# Patient Record
Sex: Female | Born: 1985 | Race: White | Hispanic: No | Marital: Single | Smoking: Never smoker
Health system: Southern US, Community
[De-identification: ages and names within clinical notes are randomized; demographics above are authoritative.]

---

## 2017-04-19 ENCOUNTER — Other Ambulatory Visit: Payer: Self-pay | Admitting: Family Medicine

## 2017-04-19 DIAGNOSIS — Z8639 Personal history of other endocrine, nutritional and metabolic disease: Secondary | ICD-10-CM

## 2017-04-24 ENCOUNTER — Ambulatory Visit
Admission: RE | Admit: 2017-04-24 | Discharge: 2017-04-24 | Disposition: A | Discharge status: Home / Self Care | Payer: BLUE CROSS/BLUE SHIELD | Source: Ambulatory Visit | Attending: Family Medicine | Admitting: Family Medicine

## 2017-04-24 DIAGNOSIS — L659 Nonscarring hair loss, unspecified: Secondary | ICD-10-CM | POA: Insufficient documentation

## 2017-04-24 DIAGNOSIS — Z8639 Personal history of other endocrine, nutritional and metabolic disease: Secondary | ICD-10-CM | POA: Insufficient documentation

## 2018-02-02 ENCOUNTER — Other Ambulatory Visit: Payer: Self-pay

## 2018-02-02 ENCOUNTER — Emergency Department
Admission: EM | Admit: 2018-02-02 | Discharge: 2018-02-02 | Disposition: A | Discharge status: Home / Self Care | Payer: BLUE CROSS/BLUE SHIELD | Attending: Emergency Medicine | Admitting: Emergency Medicine

## 2018-02-02 ENCOUNTER — Encounter: Payer: Self-pay | Admitting: Emergency Medicine

## 2018-02-02 DIAGNOSIS — B86 Scabies: Secondary | ICD-10-CM

## 2018-02-02 MED ORDER — SODIUM CHLORIDE 0.9 % IV BOLUS: 500.0000 mL | Freq: Once | INTRAVENOUS | Status: DC

## 2018-02-02 MED ORDER — HYDROXYZINE HCL 50 MG PO TABS: 50.0000 mg | ORAL_TABLET | Freq: Three times a day (TID) | ORAL | 0 refills | Status: AC | PRN

## 2018-02-02 MED ORDER — FAMOTIDINE IN NACL 20-0.9 MG/50ML-% IV SOLN: 20.0000 mg | Freq: Once | INTRAVENOUS | Status: DC

## 2018-02-02 MED ORDER — HYDROXYZINE HCL 50 MG PO TABS: 50.0000 mg | ORAL_TABLET | Freq: Once | ORAL | Status: DC

## 2018-02-02 MED ORDER — PERMETHRIN 5 % EX CREA: 1.0000 "application " | TOPICAL_CREAM | Freq: Once | CUTANEOUS | 1 refills | Status: AC

## 2018-02-02 NOTE — ED Notes (Signed)
See triage note  Presents with rash  States she has had scabies in past and developed another rash recently

## 2018-02-02 NOTE — Discharge Instructions (Signed)
Apply ambulate as directed and repeat in 1 week.

## 2018-02-02 NOTE — ED Triage Notes (Signed)
Few small red dots, itchy, thinks might be scabies.

## 2018-02-03 NOTE — ED Provider Notes (Signed)
[facility] Emergency Department Provider Note   ____________________________________________   First MD Initiated Contact with Patient 02/02/18 1451     (approximate)  I have reviewed the triage vital signs and the nursing notes.   HISTORY  Chief Complaint Insect Bite    HPI Cheryl Pollard is a 32 y.o. female patient presents with red itchy dots which she thinks is secondary to scabies.  Patient has similar complaint in the past.  Patient states she is stable of her mother's house who has the same complaints.  Patient state itches all the time but intensifies at night.  No palliative measures for complaint.  History reviewed. No pertinent past medical history.  There are no active problems to display for this patient.   History reviewed. No pertinent surgical history.  Prior to Admission medications   Medication Sig Start Date End Date Taking? Authorizing Provider  hydrOXYzine (ATARAX/VISTARIL) 50 MG tablet Take 1 tablet (50 mg total) by mouth 3 (three) times daily as needed. 02/02/18   Joni Reining, PA-C    Allergies Patient has no known allergies.  No family history on file.  Social History Social History   Tobacco Use  . Smoking status: Never Smoker  Substance Use Topics  . Alcohol use: Not on file  . Drug use: Not on file    Review of Systems Constitutional: No fever/chills Eyes: No visual changes. ENT: No sore throat. Cardiovascular: Denies chest pain. Respiratory: Denies shortness of breath. Gastrointestinal: No abdominal pain.  No nausea, no vomiting.  No diarrhea.  No constipation. Genitourinary: Negative for dysuria. Musculoskeletal: Negative for back pain. Skin: Positive for rash. Neurological: Negative for headaches, focal weakness or numbness.   ____________________________________________   PHYSICAL EXAM:  VITAL SIGNS: ED Triage Vitals  Enc Vitals Group     BP 02/02/18 1426 126/83     Pulse Rate 02/02/18  1426 87     Resp 02/02/18 1426 20     Temp 02/02/18 1426 98.6 F (37 C)     Temp Source 02/02/18 1426 Oral     SpO2 02/02/18 1426 100 %     Weight 02/02/18 1427 165 lb (74.8 kg)     Height 02/02/18 1427 5\' 8"  (1.727 m)     Head Circumference --      Peak Flow --      Pain Score 02/02/18 1427 3     Pain Loc --      Pain Edu? --      Excl. in GC? --     Constitutional: Alert and oriented. Well appearing and in no acute distress. Cardiovascular: Normal rate, regular rhythm. Grossly normal heart sounds.  Good peripheral circulation. Respiratory: Normal respiratory effort.  No retractions. Lungs CTAB. Musculoskeletal: No lower extremity tenderness nor edema.  No joint effusions. Neurologic:  Normal speech and language. No gross focal neurologic deficits are appreciated. No gait instability. Skin:  Skin is warm, dry and intact.  Linea papular lesions of her lower extremities.  Signs of excoriation.   Psychiatric: Mood and affect are normal. Speech and behavior are normal.  ____________________________________________   LABS (all labs ordered are listed, but only abnormal results are displayed)  Labs Reviewed - No data to display ____________________________________________  EKG   ____________________________________________  RADIOLOGY  ED MD interpretation:    Official radiology report(s): No results found.  ____________________________________________   PROCEDURES  Procedure(s) performed: None  Procedures  Critical Care performed: No  ____________________________________________   INITIAL IMPRESSION / ASSESSMENT  AND PLAN / ED COURSE  As part of my medical decision making, I reviewed the following data within the electronic MEDICAL RECORD NUMBER    Scabies.  Patient given discharge care instruction advised his medication as directed.  [facility]      ____________________________________________   FINAL CLINICAL  IMPRESSION(S) / ED DIAGNOSES  Final diagnoses:  Scabies     ED Discharge Orders        Ordered    permethrin (ELIMITE) 5 % cream   Once     02/02/18 1525    hydrOXYzine (ATARAX/VISTARIL) 50 MG tablet  3 times daily PRN     02/02/18 1525       Note:  This document was prepared using Dragon voice recognition software and may include unintentional dictation errors.    Joni ReiningSmith, Ronald K, PA-C 02/03/18 [id number]    Loleta RoseForbach, Cory, MD 02/03/18 [phone]

## 2018-12-28 IMAGING — US US THYROID
1 series · 14 of 25 positions shown · non-contrast
Comparison: None available

CLINICAL DATA: History of thyroid nodules, hyperthyroidism

EXAM:
THYROID ULTRASOUND
TECHNIQUE: Ultrasound examination of the thyroid gland and adjacent soft
tissues was performed.

[Series 1: us thyroid · 0.07mm/px · 14 of 47 slices shown]
[im 1/47]
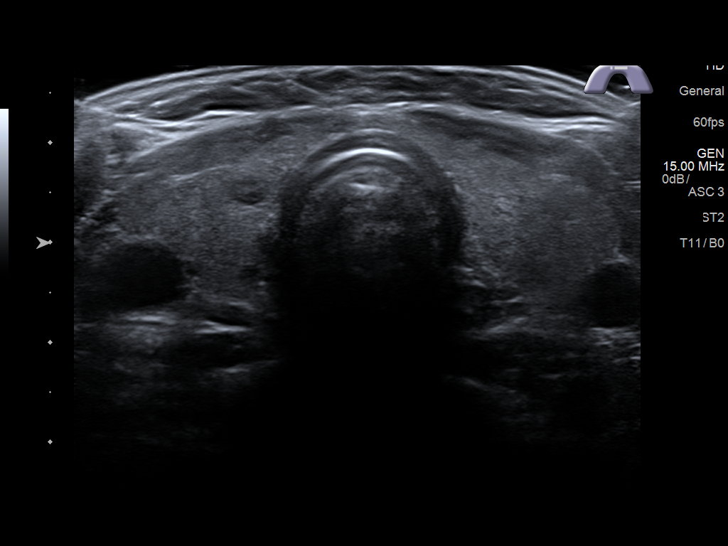
[im 4/47]
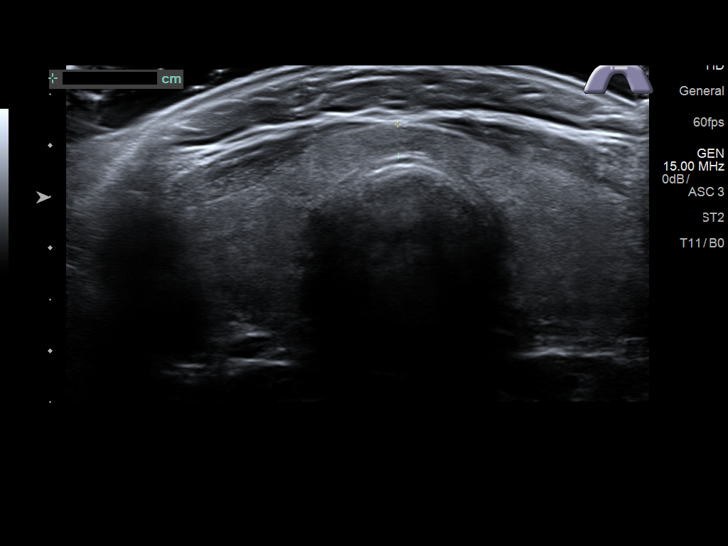
[im 8/47]
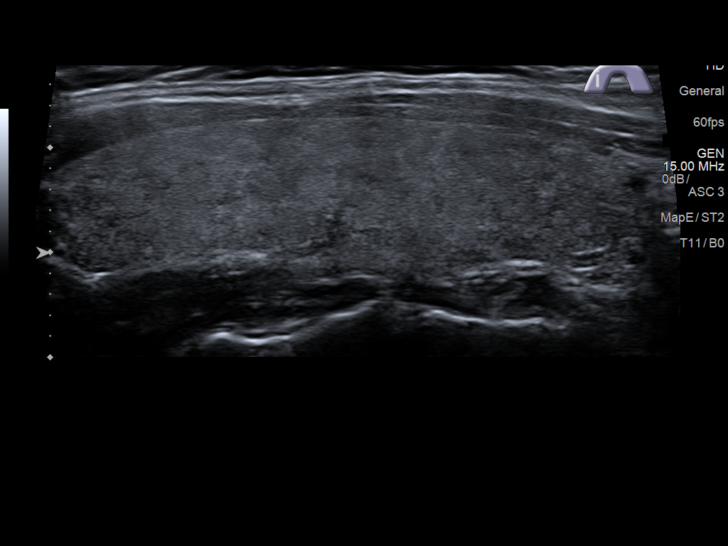
[im 12/47]
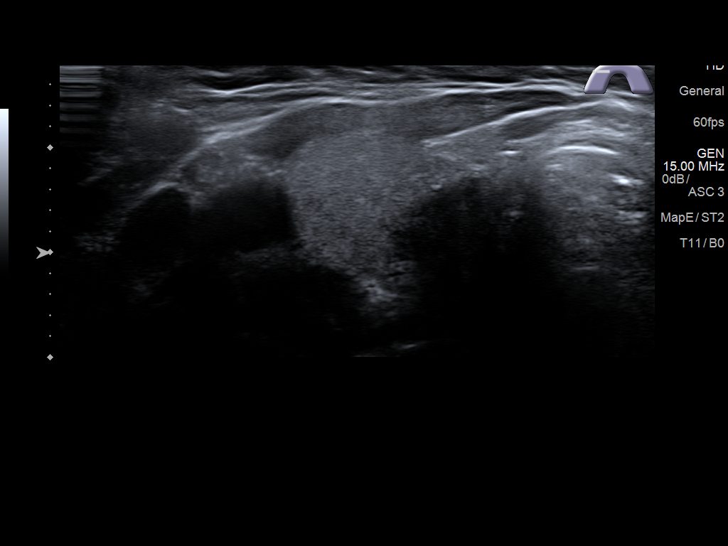
[im 16/47]
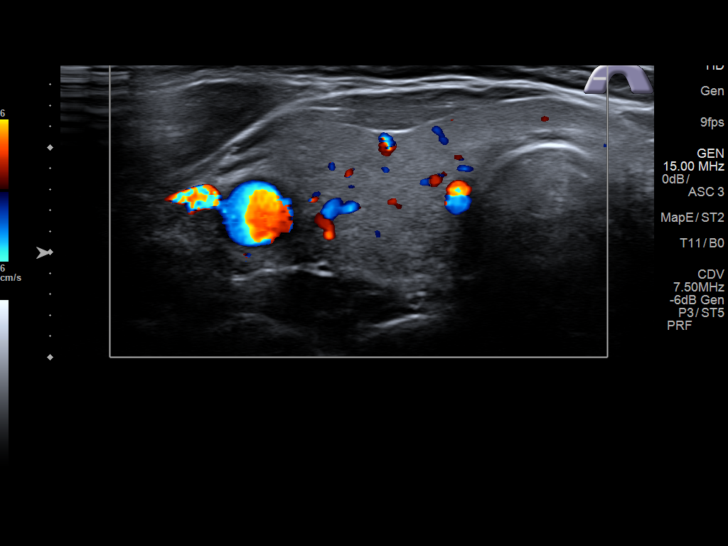
[im 18/47]
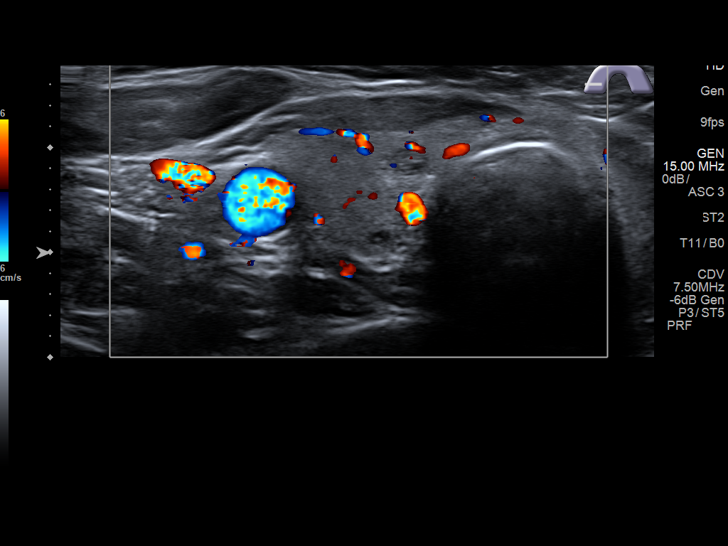
[im 22/47]
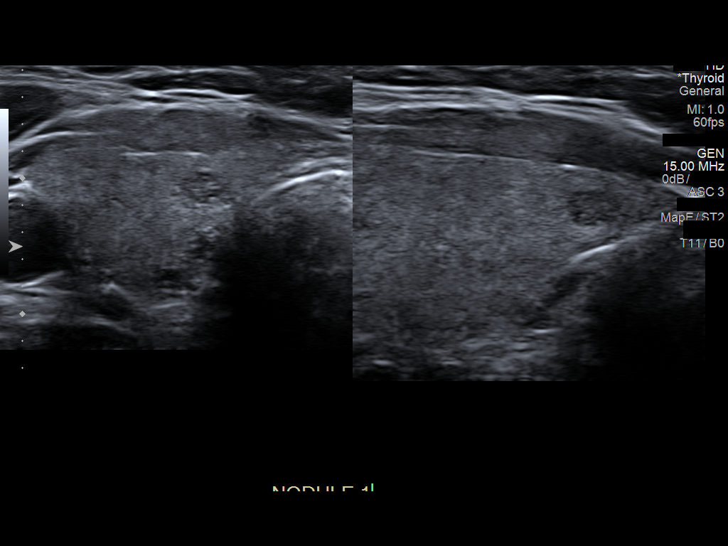
[im 25/47]
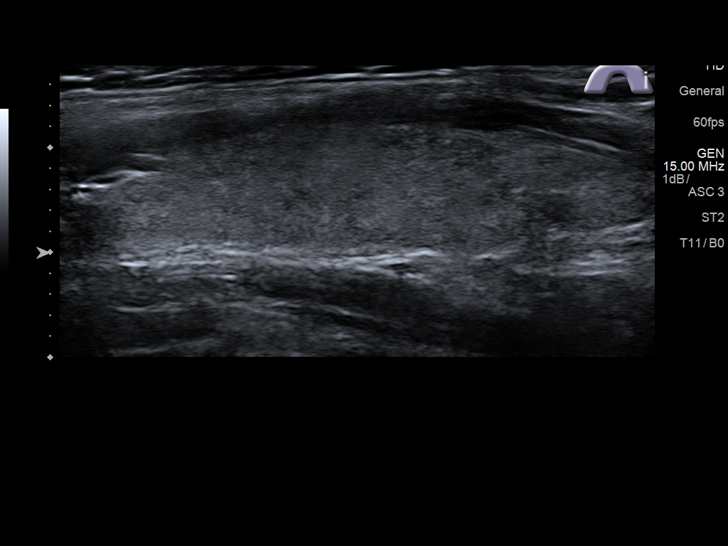
[im 29/47]
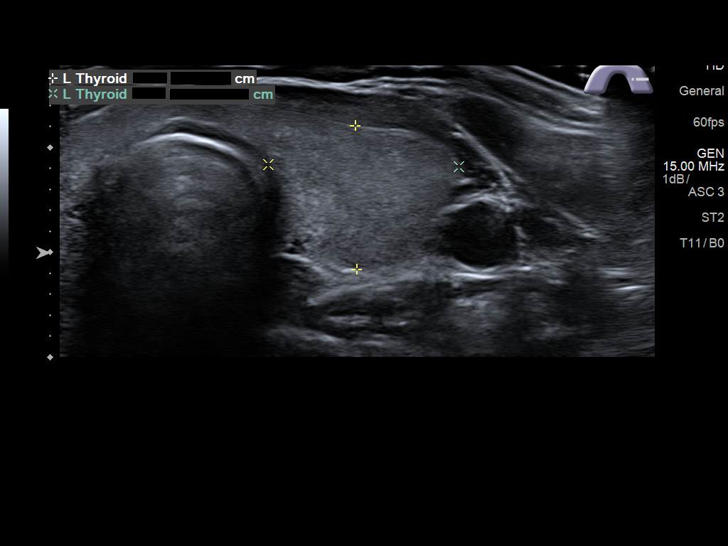
[im 31/47]
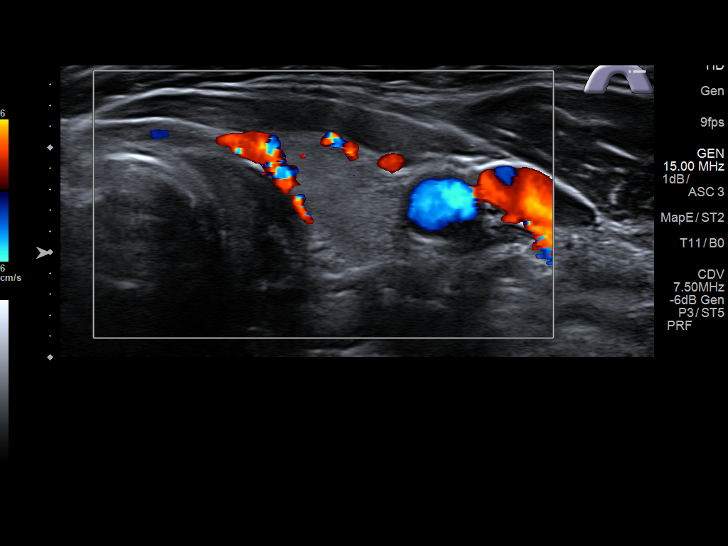
[im 35/47]
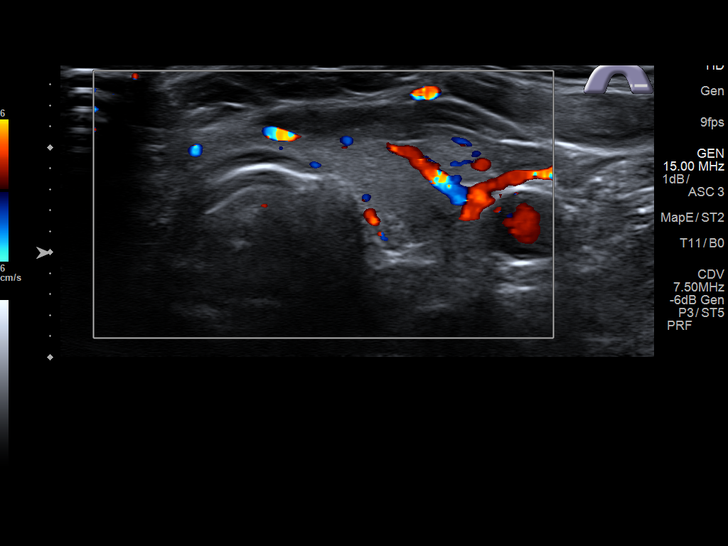
[im 39/47]
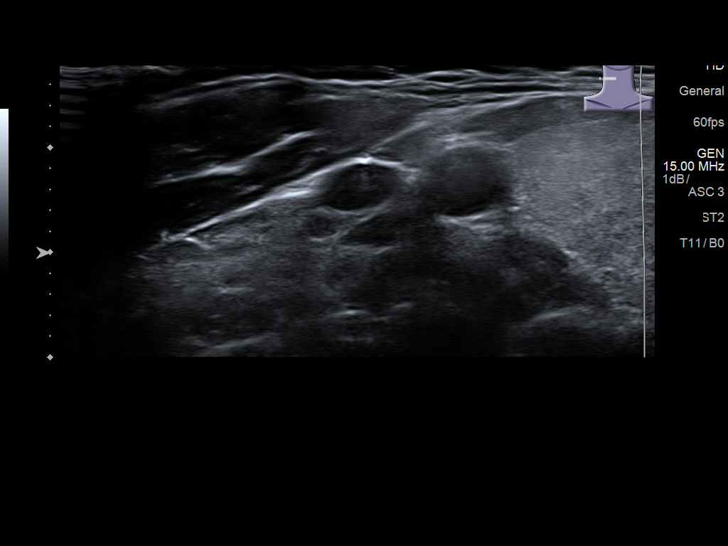
[im 43/47]
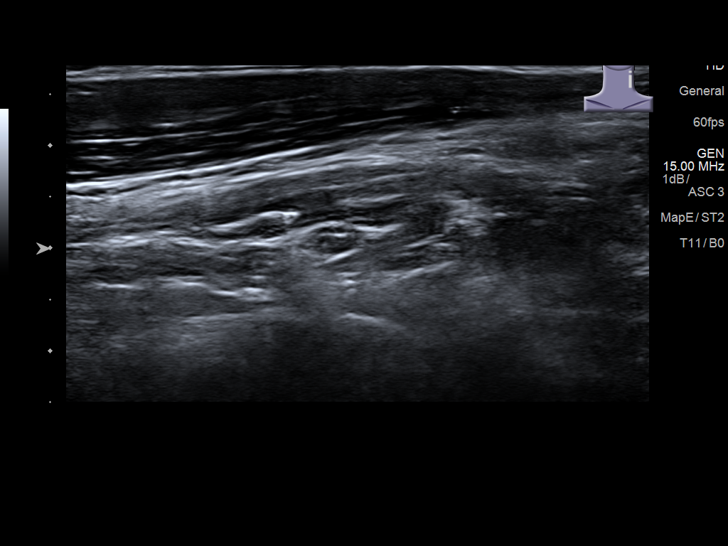
[im 47/47]
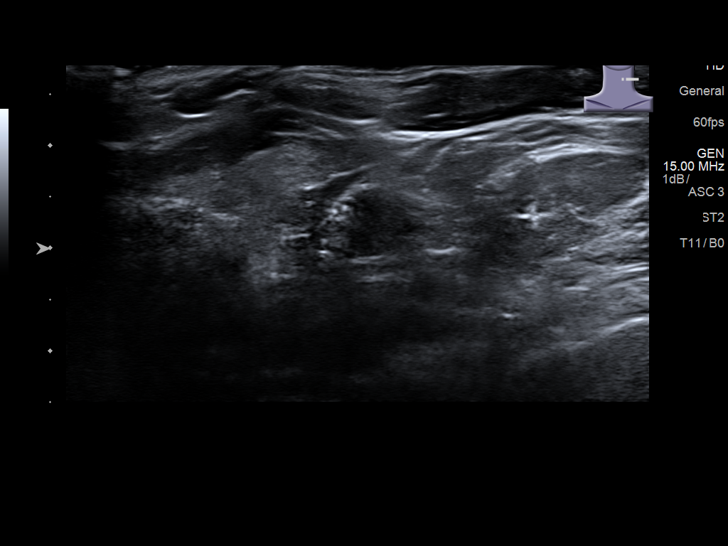

[14 of 25 positions shown; findings below may reference images not displayed]

FINDINGS: Parenchymal Echotexture: Normal

Isthmus: 3 mm

Right lobe: 5.1 x 1.7 x 1.6 cm

Left lobe: 5.6 x 1.8 x 1.4 cm

_________________________________________________________

Estimated total number of nodules >/= 1 cm: 0

Number of spongiform nodules >/=  2 cm not described below (TR1): 0

Number of mixed cystic and solid nodules >/= 1.5 cm not described
below (TR2): 0

_________________________________________________________

Incidental 5 mm hypoechoic solid nodule in the right thyroid
inferiorly. This would not meet criteria for biopsy or any
additional follow-up. No other significant thyroid abnormality.
IMPRESSION: No significant thyroid abnormality.

Incidental 5 mm right inferior thyroid nodule as above.

The above is in keeping with the ACR TI-RADS recommendations - [HOSPITAL] 6941;[DATE].
